# Patient Record
Sex: Female | Born: 1975
Health system: Southern US, Community
[De-identification: ages and names within clinical notes are randomized; demographics above are authoritative.]

## PROBLEM LIST (undated history)

## (undated) HISTORY — PX: TONSILLECTOMY: SUR1361

## (undated) HISTORY — PX: TUBAL LIGATION: SHX77

## (undated) HISTORY — PX: OTHER SURGICAL HISTORY: SHX169

---

## 2007-07-15 ENCOUNTER — Ambulatory Visit (HOSPITAL_COMMUNITY): Admission: RE | Admit: 2007-07-15 | Discharge: 2007-07-15 | Payer: Self-pay | Admitting: Specialist

## 2008-10-14 IMAGING — RF DG HYSTEROGRAM
10 series · 10 of 10 positions shown · IV contrast (omnipaque)
Comparison: none

CLINICAL DATA: Previous ectopic pregnancies.  Previous salpingectomy.  Evaluate tubal patency.
 HYSTEROSALPINGOGRAM:
TECHNIQUE: Following cleansing of the cervix and vagina with Betadine, a hysterosalpingogram catheter was placed into the endocervical canal.  Hysterosalpingogram was then performed using Omnipaque 300 contrast.  The patient tolerated the examination without difficulty.

[Series 1: run · 1 of 1 slices shown (1 of 10)]
[im 1/1]
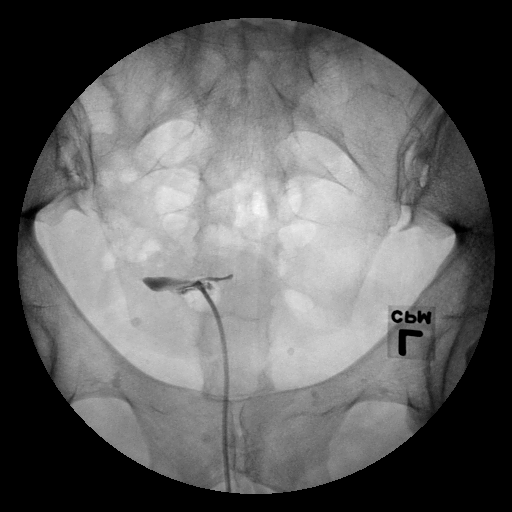

[Series 2: run · 1 of 1 slices shown (2 of 10)]
[im 1/1]
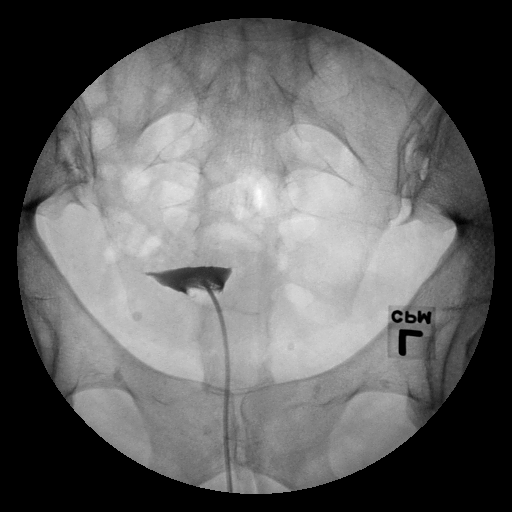

[Series 3: run · 1 of 1 slices shown (3 of 10)]
[im 1/1]
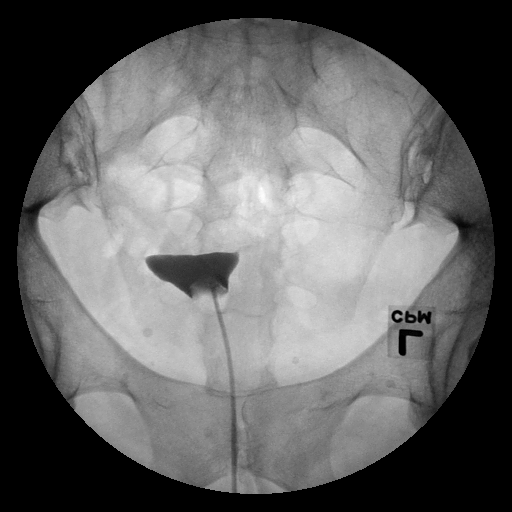

[Series 4: run · 1 of 1 slices shown (4 of 10)]
[im 1/1]
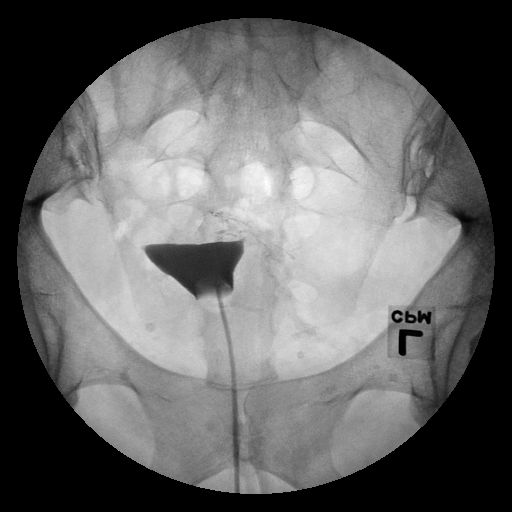

[Series 5: run · 1 of 1 slices shown (5 of 10)]
[im 1/1]
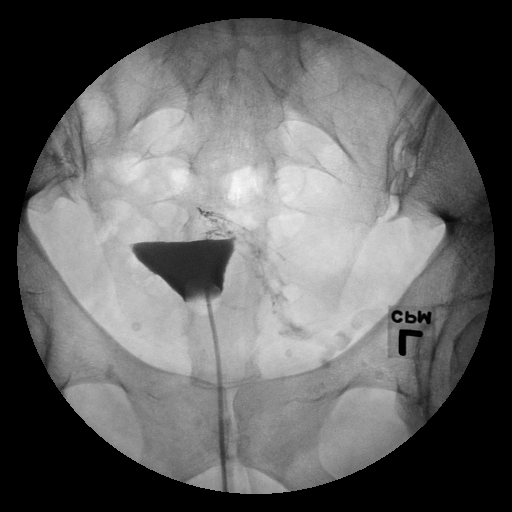

[Series 6: run · 1 of 1 slices shown (6 of 10)]
[im 1/1]
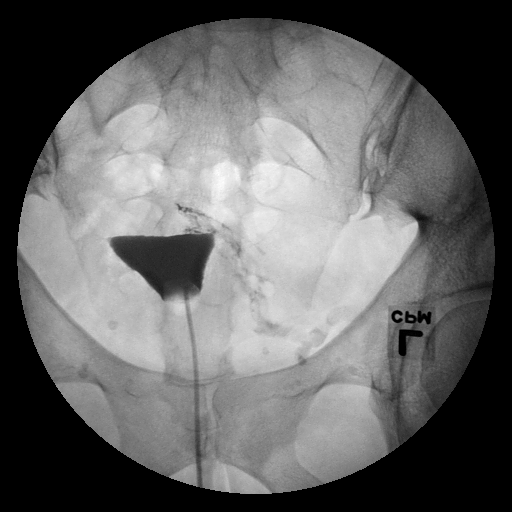

[Series 7: run · 1 of 1 slices shown (7 of 10)]
[im 1/1]
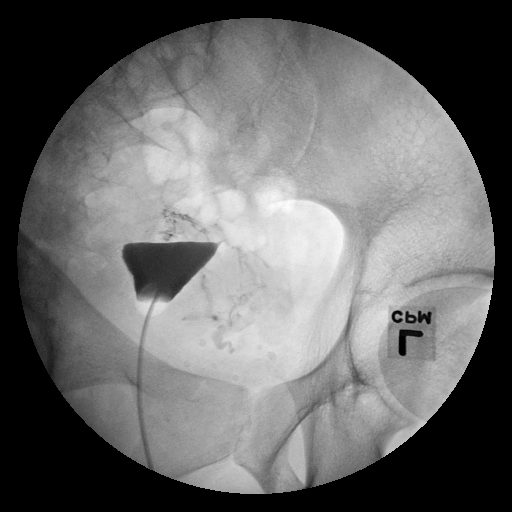

[Series 8: run · 1 of 1 slices shown (8 of 10)]
[im 1/1]
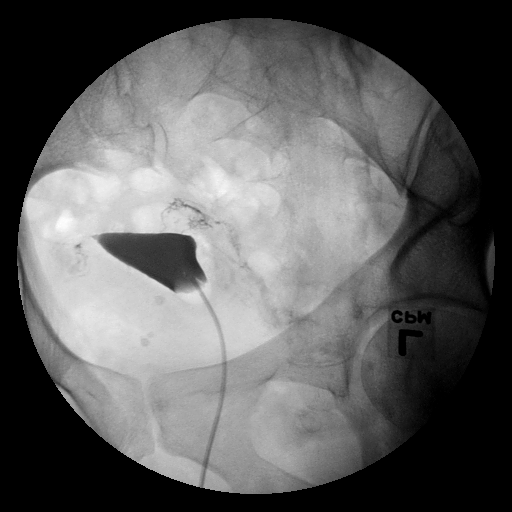

[Series 9: run · 1 of 1 slices shown (9 of 10)]
[im 1/1]
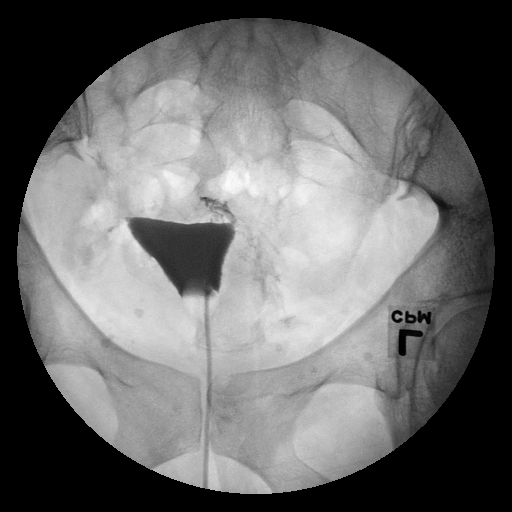

[Series 10: run · 1 of 1 slices shown (10 of 10)]
[im 1/1]
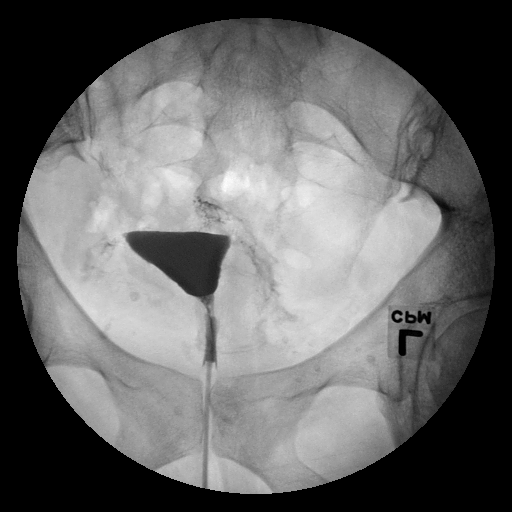

[10 of 10 positions shown; findings below may reference images not displayed]

FINDINGS: The endometrial cavity and uterus are normal in contour and appearance.  
 The patient has undergone previous right salpingectomy.  There is no contrast opacification of the left fallopian tube, consistent with proximal tubal occlusion.  Lymphovascular extravasation of contrast was noted indicating adequate injection pressure.
IMPRESSION: 1. Non-opacification of the left fallopian tube, consistent with proximal occlusion.  Previous right salpingectomy.
 2. Normal appearance of the endometrial cavity.

## 2018-04-22 ENCOUNTER — Emergency Department (HOSPITAL_BASED_OUTPATIENT_CLINIC_OR_DEPARTMENT_OTHER)
Admission: EM | Admit: 2018-04-22 | Discharge: 2018-04-22 | Disposition: A | Discharge status: Home / Self Care | Payer: Self-pay | Attending: Emergency Medicine | Admitting: Emergency Medicine

## 2018-04-22 ENCOUNTER — Other Ambulatory Visit: Payer: Self-pay

## 2018-04-22 ENCOUNTER — Encounter (HOSPITAL_BASED_OUTPATIENT_CLINIC_OR_DEPARTMENT_OTHER): Payer: Self-pay | Admitting: Emergency Medicine

## 2018-04-22 DIAGNOSIS — K529 Noninfective gastroenteritis and colitis, unspecified: Secondary | ICD-10-CM | POA: Insufficient documentation

## 2018-04-22 LAB — CBC WITH DIFFERENTIAL/PLATELET
BASOS PCT: 0 %
Basophils Absolute: 0 10*3/uL (ref 0.0–0.1)
Eosinophils Absolute: 0 10*3/uL (ref 0.0–0.7)
Eosinophils Relative: 0 %
HEMATOCRIT: 32.9 % — AB (ref 36.0–46.0)
HEMOGLOBIN: 10.6 g/dL — AB (ref 12.0–15.0)
LYMPHS PCT: 14 %
Lymphs Abs: 0.8 10*3/uL (ref 0.7–4.0)
MCH: 24.9 pg — ABNORMAL LOW (ref 26.0–34.0)
MCHC: 32.2 g/dL (ref 30.0–36.0)
MCV: 77.4 fL — ABNORMAL LOW (ref 78.0–100.0)
Monocytes Absolute: 0.7 10*3/uL (ref 0.1–1.0)
Monocytes Relative: 12 %
NEUTROS ABS: 4 10*3/uL (ref 1.7–7.7)
NEUTROS PCT: 74 %
Platelets: 314 10*3/uL (ref 150–400)
RBC: 4.25 MIL/uL (ref 3.87–5.11)
RDW: 16 % — ABNORMAL HIGH (ref 11.5–15.5)
WBC: 5.4 10*3/uL (ref 4.0–10.5)

## 2018-04-22 LAB — COMPREHENSIVE METABOLIC PANEL
ALBUMIN: 4 g/dL (ref 3.5–5.0)
ALT: 14 U/L (ref 0–44)
ANION GAP: 8 (ref 5–15)
AST: 20 U/L (ref 15–41)
Alkaline Phosphatase: 68 U/L (ref 38–126)
BILIRUBIN TOTAL: 0.6 mg/dL (ref 0.3–1.2)
BUN: 10 mg/dL (ref 6–20)
CO2: 21 mmol/L — ABNORMAL LOW (ref 22–32)
Calcium: 8.6 mg/dL — ABNORMAL LOW (ref 8.9–10.3)
Chloride: 105 mmol/L (ref 98–111)
Creatinine, Ser: 0.84 mg/dL (ref 0.44–1.00)
GFR calc Af Amer: >60 mL/min (ref >60)
GLUCOSE: 97 mg/dL (ref 70–99)
POTASSIUM: 3.4 mmol/L — AB (ref 3.5–5.1)
Sodium: 134 mmol/L — ABNORMAL LOW (ref 135–145)
TOTAL PROTEIN: 7.6 g/dL (ref 6.5–8.1)

## 2018-04-22 LAB — LIPASE, BLOOD: LIPASE: 27 U/L (ref 11–51)

## 2018-04-22 MED ORDER — PROMETHAZINE HCL 25 MG PO TABS: 25.0000 mg | ORAL_TABLET | Freq: Four times a day (QID) | ORAL | 0 refills | Status: AC | PRN

## 2018-04-22 MED ORDER — SODIUM CHLORIDE 0.9 % IV BOLUS: 1000.0000 mL | Freq: Once | INTRAVENOUS | Status: DC

## 2018-04-22 MED ORDER — ONDANSETRON HCL 4 MG/2ML IJ SOLN
4.0000 mg | Freq: Once | INTRAMUSCULAR | Status: AC
  Administered 2018-04-22: 4 mg via INTRAVENOUS
  Filled 2018-04-22: qty 2

## 2018-04-22 MED ORDER — SODIUM CHLORIDE 0.9 % IV BOLUS
1000.0000 mL | Freq: Once | INTRAVENOUS | Status: AC
  Administered 2018-04-22: 1000 mL via INTRAVENOUS

## 2018-04-22 MED ORDER — SODIUM CHLORIDE 0.9 % IV SOLN
INTRAVENOUS | Status: DC
  Administered 2018-04-22: 10:00:00 via INTRAVENOUS

## 2018-04-22 MED ORDER — LOPERAMIDE HCL 2 MG PO TABS: 2.0000 mg | ORAL_TABLET | Freq: Four times a day (QID) | ORAL | 0 refills | Status: AC | PRN

## 2018-04-22 MED FILL — PROMETHAZINE 25 MG TABLET: 25 | 7 days supply | Qty: 30 | Fill #0

## 2018-04-22 NOTE — ED Provider Notes (Signed)
[facility] EMERGENCY DEPARTMENT Provider Note   CSN: [id number] Arrival date & time: 04/22/18  0753     History   Chief Complaint Chief Complaint  Patient presents with  . Diarrhea  . Headache  . Shortness of Breath    HPI Nicole Barton is a 42 y.o. female.  Patient with exposure to a vomiting type illness from her grandchildren over the weekend.  Yesterday patient started with diarrhea has had many episodes.  Started about 6 in the morning.  Last night she started with vomiting she had nausea prior to that.  Vomiting and diarrhea is continued.  No blood in either one.  Associated with some headache associated with some chills associated with fatigue.  Associated with some crampy abdominal pain.     History reviewed. No pertinent past medical history.  There are no active problems to display for this patient.   Past Surgical History:  Procedure Laterality Date  . TONSILLECTOMY    . TUBAL LIGATION    . tubal ligation reversal       OB History   None      Home Medications    Prior to Admission medications   Medication Sig Start Date End Date Taking? Authorizing Provider  loperamide (IMODIUM A-D) 2 MG tablet Take 1 tablet (2 mg total) by mouth 4 (four) times daily as needed for diarrhea or loose stools. 04/22/18   Vanetta Mulders, MD  promethazine (PHENERGAN) 25 MG tablet Take 1 tablet (25 mg total) by mouth every 6 (six) hours as needed for nausea or vomiting. 04/22/18   Vanetta Mulders, MD    Family History Family History  Problem Relation Age of Onset  . Emphysema Mother   . Hypertension Mother   . Diabetes Mother   . Vaginal cancer Mother   . Cancer Other     Social History Social History   Tobacco Use  . Smoking status: Never Smoker  . Smokeless tobacco: Never Used  Substance Use Topics  . Alcohol use: Never    Frequency: Never  . Drug use: Never     Allergies   Codeine and Penicillins   Review of Systems Review of Systems    Constitutional: Positive for chills and fatigue. Negative for fever.  HENT: Negative for congestion.   Eyes: Negative for visual disturbance.  Respiratory: Negative for shortness of breath.   Gastrointestinal: Positive for abdominal pain, diarrhea, nausea and vomiting.  Genitourinary: Negative for dysuria.  Musculoskeletal: Negative for myalgias and neck stiffness.  Skin: Negative for rash.  Neurological: Positive for headaches.  Hematological: Does not bruise/bleed easily.  Psychiatric/Behavioral: Negative for confusion.     Physical Exam Updated Vital Signs BP (!) 119/44 (BP Location: Right Arm)   Pulse 68   Temp 98.2 F (36.8 C) (Oral)   Resp 17   Ht 1.702 m (5\' 7" )   Wt 79.4 kg (175 lb)   LMP 04/15/2018 (Approximate)   SpO2 98%   BMI 27.41 kg/m   Physical Exam  Constitutional: She is oriented to person, place, and time. She appears well-developed and well-nourished. No distress.  HENT:  Head: Normocephalic and atraumatic.  Mucous membranes dry  Eyes: Pupils are equal, round, and reactive to light. EOM are normal.  Neck: Normal range of motion. Neck supple.  Cardiovascular: Normal rate, regular rhythm and normal heart sounds.  Pulmonary/Chest: Effort normal and breath sounds normal.  Abdominal: Soft. Bowel sounds are normal. She exhibits no distension. There is no tenderness.  Musculoskeletal:  Normal range of motion. She exhibits no edema.  Neurological: She is alert and oriented to person, place, and time. No cranial nerve deficit or sensory deficit. She exhibits normal muscle tone. Coordination normal.  Skin: Skin is warm. No rash noted.  Nursing note and vitals reviewed.    ED Treatments / Results  Labs (all labs ordered are listed, but only abnormal results are displayed) Labs Reviewed  COMPREHENSIVE METABOLIC PANEL - Abnormal; Notable for the following components:      Result Value   Sodium 134 (*)    Potassium 3.4 (*)    CO2 21 (*)    Calcium 8.6 (*)     All other components within normal limits  CBC WITH DIFFERENTIAL/PLATELET - Abnormal; Notable for the following components:   Hemoglobin 10.6 (*)    HCT 32.9 (*)    MCV 77.4 (*)    MCH 24.9 (*)    RDW 16.0 (*)    All other components within normal limits  LIPASE, BLOOD    EKG None  Radiology No results found.  Procedures Procedures (including critical care time)  Medications Ordered in ED Medications  0.9 %  sodium chloride infusion ( Intravenous Rate/Dose Change 04/22/18 1027)  sodium chloride 0.9 % bolus 1,000 mL (has no administration in time range)  sodium chloride 0.9 % bolus 1,000 mL (0 mLs Intravenous Stopped 04/22/18 0942)  ondansetron (ZOFRAN) injection 4 mg (4 mg Intravenous Given 04/22/18 0853)     Initial Impression / Assessment and Plan / ED Course  I have reviewed the triage vital signs and the nursing notes.  Pertinent labs & imaging results that were available during my care of the patient were reviewed by me and considered in my medical decision making (see chart for details).     Patient symptoms consistent with viral gastroenteritis.  Patient has some grandchildren that had vomiting type illness over the weekend.  Patient here with watery diarrhea and episodes of vomiting yesterday.  And continuing into today.  No blood in the vomit.  Labs without significant abnormalities little bit decrease in sodium.  Patient received 2 L of normal saline here now urinating well.  Abdomen soft no acute abdominal process.  Patient will be treated with Imodium and Phenergan.  Patient also given Zofran here with resolution of the vomiting. Patient not nontoxic no acute distress.  Final Clinical Impressions(s) / ED Diagnoses   Final diagnoses:  Gastroenteritis    ED Discharge Orders        Ordered    loperamide (IMODIUM A-D) 2 MG tablet  4 times daily PRN     04/22/18 1121    promethazine (PHENERGAN) 25 MG tablet  Every 6 hours PRN     04/22/18 1121        Vanetta MuldersZackowski, Lakaisha Danish, MD 04/22/18 1127

## 2018-04-22 NOTE — ED Notes (Signed)
ED Provider at bedside. 

## 2018-04-22 NOTE — ED Notes (Cosign Needed)
Pt states her headache and her nausea has improved but continues to have abd cramping

## 2018-04-22 NOTE — Discharge Instructions (Signed)
Take the Phenergan for the nausea and vomiting take the Imodium for the diarrhea.  Work on trying to hydrate yourself small amounts of fluid frequently something with sugar because it will provide some calories.  Once vomiting under control can start with bland diet.  Return for any new or worse symptoms.

## 2018-04-22 NOTE — ED Triage Notes (Signed)
Pt states she woke up yesterday morning and did not feel good  Pt states she had watery diarrhea last night  Pt is c/o headache and her neck is sore and the back of her head hurts, nausea, and feels short of breath with exertion

## 2019-07-19 ENCOUNTER — Other Ambulatory Visit: Payer: Self-pay | Admitting: Obstetrics and Gynecology

## 2019-07-19 DIAGNOSIS — N920 Excessive and frequent menstruation with regular cycle: Secondary | ICD-10-CM

## 2019-07-19 DIAGNOSIS — N946 Dysmenorrhea, unspecified: Secondary | ICD-10-CM

## 2019-08-09 ENCOUNTER — Other Ambulatory Visit: Payer: Self-pay
